# Patient Record
Sex: Male | Born: 1945 | Race: White | Hispanic: No | Marital: Married
Health system: Southern US, Community
[De-identification: ages and names within clinical notes are randomized; demographics above are authoritative.]

---

## 2004-05-03 ENCOUNTER — Encounter: Admission: RE | Admit: 2004-05-03 | Discharge: 2004-05-03 | Payer: Self-pay | Admitting: Family Medicine

## 2004-05-10 ENCOUNTER — Encounter: Admission: RE | Admit: 2004-05-10 | Discharge: 2004-05-10 | Payer: Self-pay | Admitting: Family Medicine

## 2005-05-16 ENCOUNTER — Encounter: Admission: RE | Admit: 2005-05-16 | Discharge: 2005-05-16 | Payer: Self-pay | Admitting: Family Medicine

## 2007-11-24 ENCOUNTER — Emergency Department (HOSPITAL_COMMUNITY): Admission: EM | Admit: 2007-11-24 | Discharge: 2007-11-24 | Payer: Self-pay | Admitting: Emergency Medicine

## 2009-08-22 ENCOUNTER — Emergency Department (HOSPITAL_COMMUNITY): Admission: EM | Admit: 2009-08-22 | Discharge: 2009-08-22 | Payer: Self-pay | Admitting: Emergency Medicine

## 2009-09-15 ENCOUNTER — Ambulatory Visit (HOSPITAL_COMMUNITY): Admission: RE | Admit: 2009-09-15 | Discharge: 2009-09-15 | Payer: Self-pay | Admitting: Urology

## 2009-12-07 ENCOUNTER — Ambulatory Visit (HOSPITAL_BASED_OUTPATIENT_CLINIC_OR_DEPARTMENT_OTHER): Admission: RE | Admit: 2009-12-07 | Discharge: 2009-12-07 | Payer: Self-pay | Admitting: Urology

## 2010-01-11 ENCOUNTER — Ambulatory Visit: Admission: RE | Admit: 2010-01-11 | Discharge: 2010-02-22 | Payer: Self-pay | Admitting: Radiation Oncology

## 2010-04-05 ENCOUNTER — Ambulatory Visit
Admission: RE | Admit: 2010-04-05 | Discharge: 2010-05-11 | Payer: Self-pay | Source: Home / Self Care | Admitting: Radiation Oncology

## 2010-04-23 IMAGING — NM NM RENAL IMAGING FLOW W/ PHARM
2 series · 12 of 12 positions shown · non-contrast
Comparison: CT dated 08/22/2009.

CLINICAL DATA: Horseshoe kidney with a chronic right UPJ
obstruction on a recent CT.

NUCLEAR MEDICINE RENAL SCINTIANGIOGRAPHY WITH FLOW AND FUNCTION AND
PHARMACOLOGIC AUGMENTATION
TECHNIQUE: Radionuclide angiographic and sequential renal images
were obtained after intravenous injection of radiopharmaceutical.
Imaging was continued during slow intravenous injection of Lasix
approximately 20-30 minutes after the start of the examination.
Radiopharmaceutical: 15.2 mCi technetium 99m Mag III

[Series 1: re renal qualitative · 9.44mm/px · 6 of 120 frames shown (1 of 2)]
[frame 11/120]
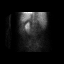
[frame 31/120]
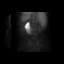
[frame 51/120]
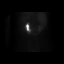
[frame 71/120]
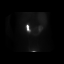
[frame 91/120]
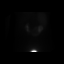
[frame 111/120]
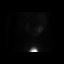

[Series 1: re renal qualitative · 9.44mm/px · 6 of 120 frames shown (2 of 2)]
[frame 11/120]
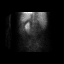
[frame 31/120]
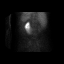
[frame 51/120]
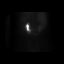
[frame 71/120]
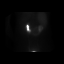
[frame 91/120]
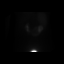
[frame 111/120]
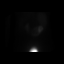

[12 of 12 positions shown; findings below may reference images not displayed]

FINDINGS: Horseshoe kidney with normal uptake and clearance of
tracer on the left.  On the right, there is persistent, low grade
accumulation of tracer without clearance following Lasix
administration.
IMPRESSION: Horseshoe kidney with a right UPJ obstruction.

## 2010-06-08 ENCOUNTER — Ambulatory Visit
Admission: RE | Admit: 2010-06-08 | Discharge: 2010-07-13 | Payer: Self-pay | Source: Home / Self Care | Attending: Radiation Oncology | Admitting: Radiation Oncology

## 2010-06-09 ENCOUNTER — Encounter
Admission: RE | Admit: 2010-06-09 | Discharge: 2010-06-09 | Payer: Self-pay | Source: Home / Self Care | Attending: Urology | Admitting: Urology

## 2010-07-06 LAB — APTT: aPTT: 33 seconds (ref 24–37)

## 2010-07-06 LAB — COMPREHENSIVE METABOLIC PANEL
ALT: 17 U/L (ref 0–53)
AST: 22 U/L (ref 0–37)
Albumin: 4.2 g/dL (ref 3.5–5.2)
Alkaline Phosphatase: 66 U/L (ref 39–117)
BUN: 22 mg/dL (ref 6–23)
CO2: 31 mEq/L (ref 19–32)
Calcium: 9.3 mg/dL (ref 8.4–10.5)
Chloride: 101 mEq/L (ref 96–112)
Creatinine, Ser: 1.68 mg/dL — ABNORMAL HIGH (ref 0.4–1.5)
GFR calc Af Amer: 50 mL/min — ABNORMAL LOW (ref >60)
GFR calc non Af Amer: 41 mL/min — ABNORMAL LOW (ref >60)
Glucose, Bld: 88 mg/dL (ref 70–99)
Potassium: 4.1 mEq/L (ref 3.5–5.1)
Sodium: 140 mEq/L (ref 135–145)
Total Bilirubin: 0.6 mg/dL (ref 0.3–1.2)
Total Protein: 6.8 g/dL (ref 6.0–8.3)

## 2010-07-06 LAB — CBC
HCT: 44 % (ref 39.0–52.0)
Hemoglobin: 15 g/dL (ref 13.0–17.0)
MCH: 31.3 pg (ref 26.0–34.0)
MCHC: 34.1 g/dL (ref 30.0–36.0)
MCV: 91.9 fL (ref 78.0–100.0)
Platelets: 175 10*3/uL (ref 150–400)
RBC: 4.79 MIL/uL (ref 4.22–5.81)
RDW: 12.4 % (ref 11.5–15.5)
WBC: 4.9 10*3/uL (ref 4.0–10.5)

## 2010-07-06 LAB — PROTIME-INR
INR: 0.97 (ref 0.00–1.49)
Prothrombin Time: 13.1 seconds (ref 11.6–15.2)

## 2010-07-12 ENCOUNTER — Ambulatory Visit
Admission: RE | Admit: 2010-07-12 | Discharge: 2010-07-12 | Payer: Self-pay | Source: Home / Self Care | Attending: Urology | Admitting: Urology

## 2010-07-14 ENCOUNTER — Ambulatory Visit: Payer: Medicare Other | Admitting: Radiation Oncology

## 2010-08-02 ENCOUNTER — Ambulatory Visit: Payer: Medicare Other | Attending: Radiation Oncology | Admitting: Radiation Oncology

## 2010-08-02 DIAGNOSIS — R350 Frequency of micturition: Secondary | ICD-10-CM | POA: Insufficient documentation

## 2010-08-02 DIAGNOSIS — C61 Malignant neoplasm of prostate: Secondary | ICD-10-CM | POA: Insufficient documentation

## 2010-08-09 NOTE — Op Note (Signed)
  NAMEELLINGTON, GREENSLADE                ACCOUNT NO.:  [id number]  MEDICAL RECORD NO.:  [id number]          PATIENT TYPE:  REC  LOCATION:  [facility]                         FACILITY:  [facility]  PHYSICIAN:  Baila Rouse I. Patsi Sears, M.D.DATE OF BIRTH:  [birth year]  DATE OF PROCEDURE: DATE OF DISCHARGE:                              OPERATIVE REPORT   PREOPERATIVE DIAGNOSIS:  T1c adenocarcinoma of the prostate.  POSTOPERATIVE DIAGNOSIS:  T1c adenocarcinoma of the prostate.  OPERATION:  Implantation of Iodine-125 radioactive seed (26 needles, 76 seeds).  PREPARATION:  After appropriate preanesthesia, the patient was brought to the operating room, placed upon the operating room in dorsal supine position where general LMA anesthesia was introduced.  He was then replaced in dorsal lithotomy position where the pubis was prepped with Betadine solution and draped in usual fashion.  REVIEW OF HISTORY:  This 64 year old male has a history of BPH treated with Flomax by Dr. Manus Gunning, with elevated PSA of 2.26 on Avodart in 2008 in a 36 mL gland.  PSA rose, however, to 5.28, and the patient had prostate biopsy showing adenocarcinoma of the prostate, Gleason 3+3 disease.  The patient had an IPSS equal to 22 prior to Flomax, but believed he was 40% better on Flomax.  IPSS decreased to 9 while on Avodart and Rapaflo combination.  The patient has a known horseshoe kidney, with a right UPJ obstruction.  PROCEDURE:  With the patient in lithotomy position, he underwent implantation of 76 seeds in 26 activated needles in his prostate. Cystoscopy after the procedure showed clot within the urethra, and the bladder balloon was popped.  However, no seeds were in the bladder or the urethra.  The urethra was irrigated free of clot, Foley catheter was placed, and the patient received IV Toradol, awakened and taken recovery room in good condition.     Merrilee Ancona I. Patsi Sears, M.D.     SIT/MEDQ  D:  07/12/2010  T:   07/12/2010  Job:  [id number]  cc:   Maryln Gottron, M.D. Fax: [phone]  Bryan Lemma. Manus Gunning, M.D. Fax: [phone]  Electronically Signed by Jethro Bolus M.D. on 08/09/2010 09:34:35 AM

## 2010-08-29 LAB — POCT HEMOGLOBIN-HEMACUE: Hemoglobin: 15.8 g/dL (ref 13.0–17.0)

## 2010-09-06 LAB — CBC
HCT: 40.3 % (ref 39.0–52.0)
Hemoglobin: 13.7 g/dL (ref 13.0–17.0)
MCHC: 33.9 g/dL (ref 30.0–36.0)
MCV: 93 fL (ref 78.0–100.0)
Platelets: 207 10*3/uL (ref 150–400)
RBC: 4.34 MIL/uL (ref 4.22–5.81)
RDW: 12.9 % (ref 11.5–15.5)
WBC: 10.6 10*3/uL — ABNORMAL HIGH (ref 4.0–10.5)

## 2010-09-06 LAB — COMPREHENSIVE METABOLIC PANEL
ALT: 26 U/L (ref 0–53)
AST: 22 U/L (ref 0–37)
Albumin: 3.6 g/dL (ref 3.5–5.2)
Alkaline Phosphatase: 71 U/L (ref 39–117)
BUN: 14 mg/dL (ref 6–23)
CO2: 27 mEq/L (ref 19–32)
Calcium: 8 mg/dL — ABNORMAL LOW (ref 8.4–10.5)
Chloride: 102 mEq/L (ref 96–112)
Creatinine, Ser: 1.23 mg/dL (ref 0.4–1.5)
GFR calc Af Amer: >60 mL/min (ref >60)
GFR calc non Af Amer: 59 mL/min — ABNORMAL LOW (ref >60)
Glucose, Bld: 93 mg/dL (ref 70–99)
Potassium: 3.1 mEq/L — ABNORMAL LOW (ref 3.5–5.1)
Sodium: 135 mEq/L (ref 135–145)
Total Bilirubin: 0.6 mg/dL (ref 0.3–1.2)
Total Protein: 6.5 g/dL (ref 6.0–8.3)

## 2010-09-06 LAB — URINALYSIS, ROUTINE W REFLEX MICROSCOPIC
Bilirubin Urine: NEGATIVE
Glucose, UA: NEGATIVE mg/dL
Hgb urine dipstick: NEGATIVE
Ketones, ur: NEGATIVE mg/dL
Nitrite: NEGATIVE
Protein, ur: NEGATIVE mg/dL
Specific Gravity, Urine: 1.016 (ref 1.005–1.030)
Urobilinogen, UA: 0.2 mg/dL (ref 0.0–1.0)
pH: 7.5 (ref 5.0–8.0)

## 2010-09-06 LAB — DIFFERENTIAL
Basophils Absolute: 0 10*3/uL (ref 0.0–0.1)
Basophils Relative: 0 % (ref 0–1)
Eosinophils Absolute: 0 10*3/uL (ref 0.0–0.7)
Eosinophils Relative: 0 % (ref 0–5)
Lymphocytes Relative: 7 % — ABNORMAL LOW (ref 12–46)
Lymphs Abs: 0.7 10*3/uL (ref 0.7–4.0)
Monocytes Absolute: 0.7 10*3/uL (ref 0.1–1.0)
Monocytes Relative: 7 % (ref 3–12)
Neutro Abs: 9.1 10*3/uL — ABNORMAL HIGH (ref 1.7–7.7)
Neutrophils Relative %: 86 % — ABNORMAL HIGH (ref 43–77)

## 2011-03-10 LAB — COMPREHENSIVE METABOLIC PANEL
ALT: 22
AST: 21
Albumin: 4
Alkaline Phosphatase: 62
BUN: 18
CO2: 31
Calcium: 9.4
Chloride: 101
Creatinine, Ser: 1.08
GFR calc Af Amer: >60
GFR calc non Af Amer: >60
Glucose, Bld: 102 — ABNORMAL HIGH
Potassium: 4.3
Sodium: 139
Total Bilirubin: 0.7
Total Protein: 6.8

## 2011-03-10 LAB — URINALYSIS, ROUTINE W REFLEX MICROSCOPIC
Bilirubin Urine: NEGATIVE
Glucose, UA: NEGATIVE
Hgb urine dipstick: NEGATIVE
Ketones, ur: NEGATIVE
Nitrite: NEGATIVE
Protein, ur: NEGATIVE
Specific Gravity, Urine: 1.03
Urobilinogen, UA: 0.2
pH: 6

## 2011-03-10 LAB — DIFFERENTIAL
Basophils Absolute: 0
Basophils Relative: 1
Eosinophils Absolute: 0.1
Eosinophils Relative: 3
Lymphocytes Relative: 23
Lymphs Abs: 1.3
Monocytes Absolute: 0.6
Monocytes Relative: 10
Neutro Abs: 3.6
Neutrophils Relative %: 63

## 2011-03-10 LAB — CBC
HCT: 44.2
Hemoglobin: 15.1
MCHC: 34.2
MCV: 91.9
Platelets: 175
RBC: 4.8
RDW: 12.2
WBC: 5.7

## 2015-07-06 DIAGNOSIS — H269 Unspecified cataract: Secondary | ICD-10-CM | POA: Diagnosis not present

## 2015-07-06 DIAGNOSIS — H1851 Endothelial corneal dystrophy: Secondary | ICD-10-CM | POA: Diagnosis not present

## 2015-08-11 DIAGNOSIS — L570 Actinic keratosis: Secondary | ICD-10-CM | POA: Diagnosis not present

## 2015-08-11 DIAGNOSIS — E559 Vitamin D deficiency, unspecified: Secondary | ICD-10-CM | POA: Diagnosis not present

## 2015-08-11 DIAGNOSIS — N183 Chronic kidney disease, stage 3 (moderate): Secondary | ICD-10-CM | POA: Diagnosis not present

## 2015-08-11 DIAGNOSIS — Z0000 Encounter for general adult medical examination without abnormal findings: Secondary | ICD-10-CM | POA: Diagnosis not present

## 2015-08-11 DIAGNOSIS — Z8546 Personal history of malignant neoplasm of prostate: Secondary | ICD-10-CM | POA: Diagnosis not present

## 2015-08-11 DIAGNOSIS — Z1389 Encounter for screening for other disorder: Secondary | ICD-10-CM | POA: Diagnosis not present

## 2015-08-11 DIAGNOSIS — I129 Hypertensive chronic kidney disease with stage 1 through stage 4 chronic kidney disease, or unspecified chronic kidney disease: Secondary | ICD-10-CM | POA: Diagnosis not present

## 2015-10-05 DIAGNOSIS — H1851 Endothelial corneal dystrophy: Secondary | ICD-10-CM | POA: Diagnosis not present

## 2015-10-05 DIAGNOSIS — H2513 Age-related nuclear cataract, bilateral: Secondary | ICD-10-CM | POA: Diagnosis not present

## 2015-10-14 DIAGNOSIS — D485 Neoplasm of uncertain behavior of skin: Secondary | ICD-10-CM | POA: Diagnosis not present

## 2015-10-14 DIAGNOSIS — L98499 Non-pressure chronic ulcer of skin of other sites with unspecified severity: Secondary | ICD-10-CM | POA: Diagnosis not present

## 2015-10-14 DIAGNOSIS — D1801 Hemangioma of skin and subcutaneous tissue: Secondary | ICD-10-CM | POA: Diagnosis not present

## 2015-10-14 DIAGNOSIS — L820 Inflamed seborrheic keratosis: Secondary | ICD-10-CM | POA: Diagnosis not present

## 2015-10-14 DIAGNOSIS — L570 Actinic keratosis: Secondary | ICD-10-CM | POA: Diagnosis not present

## 2016-04-13 DIAGNOSIS — H1851 Endothelial corneal dystrophy: Secondary | ICD-10-CM | POA: Diagnosis not present

## 2016-04-13 DIAGNOSIS — H2513 Age-related nuclear cataract, bilateral: Secondary | ICD-10-CM | POA: Diagnosis not present

## 2016-04-26 DIAGNOSIS — H1851 Endothelial corneal dystrophy: Secondary | ICD-10-CM | POA: Diagnosis not present

## 2016-04-26 DIAGNOSIS — I129 Hypertensive chronic kidney disease with stage 1 through stage 4 chronic kidney disease, or unspecified chronic kidney disease: Secondary | ICD-10-CM | POA: Diagnosis not present

## 2016-04-26 DIAGNOSIS — Z23 Encounter for immunization: Secondary | ICD-10-CM | POA: Diagnosis not present

## 2016-05-27 DIAGNOSIS — Z9841 Cataract extraction status, right eye: Secondary | ICD-10-CM | POA: Diagnosis not present

## 2016-05-27 DIAGNOSIS — Z961 Presence of intraocular lens: Secondary | ICD-10-CM | POA: Diagnosis not present

## 2016-05-27 DIAGNOSIS — Z9889 Other specified postprocedural states: Secondary | ICD-10-CM | POA: Diagnosis not present

## 2016-05-27 DIAGNOSIS — Z7982 Long term (current) use of aspirin: Secondary | ICD-10-CM | POA: Diagnosis not present

## 2016-05-27 DIAGNOSIS — H2513 Age-related nuclear cataract, bilateral: Secondary | ICD-10-CM | POA: Diagnosis not present

## 2016-05-27 DIAGNOSIS — Z79899 Other long term (current) drug therapy: Secondary | ICD-10-CM | POA: Diagnosis not present

## 2016-05-27 DIAGNOSIS — H1851 Endothelial corneal dystrophy: Secondary | ICD-10-CM | POA: Diagnosis not present

## 2016-05-27 DIAGNOSIS — I10 Essential (primary) hypertension: Secondary | ICD-10-CM | POA: Diagnosis not present

## 2016-06-16 DIAGNOSIS — Z7982 Long term (current) use of aspirin: Secondary | ICD-10-CM | POA: Diagnosis not present

## 2016-06-16 DIAGNOSIS — H2511 Age-related nuclear cataract, right eye: Secondary | ICD-10-CM | POA: Diagnosis not present

## 2016-06-16 DIAGNOSIS — Z8546 Personal history of malignant neoplasm of prostate: Secondary | ICD-10-CM | POA: Diagnosis not present

## 2016-06-16 DIAGNOSIS — H2513 Age-related nuclear cataract, bilateral: Secondary | ICD-10-CM | POA: Diagnosis not present

## 2016-06-16 DIAGNOSIS — I10 Essential (primary) hypertension: Secondary | ICD-10-CM | POA: Diagnosis not present

## 2016-06-16 DIAGNOSIS — H1851 Endothelial corneal dystrophy: Secondary | ICD-10-CM | POA: Diagnosis not present

## 2016-06-16 DIAGNOSIS — Z85828 Personal history of other malignant neoplasm of skin: Secondary | ICD-10-CM | POA: Diagnosis not present

## 2016-06-20 DIAGNOSIS — H1851 Endothelial corneal dystrophy: Secondary | ICD-10-CM | POA: Diagnosis not present

## 2016-06-20 DIAGNOSIS — H2512 Age-related nuclear cataract, left eye: Secondary | ICD-10-CM | POA: Diagnosis not present

## 2016-06-20 DIAGNOSIS — Z947 Corneal transplant status: Secondary | ICD-10-CM | POA: Diagnosis not present

## 2016-06-20 DIAGNOSIS — Z9841 Cataract extraction status, right eye: Secondary | ICD-10-CM | POA: Diagnosis not present

## 2016-06-20 DIAGNOSIS — Z961 Presence of intraocular lens: Secondary | ICD-10-CM | POA: Diagnosis not present

## 2016-06-20 DIAGNOSIS — Z48810 Encounter for surgical aftercare following surgery on the sense organs: Secondary | ICD-10-CM | POA: Diagnosis not present

## 2016-07-05 DIAGNOSIS — C61 Malignant neoplasm of prostate: Secondary | ICD-10-CM | POA: Diagnosis not present

## 2016-07-15 DIAGNOSIS — Q6211 Congenital occlusion of ureteropelvic junction: Secondary | ICD-10-CM | POA: Diagnosis not present

## 2016-07-15 DIAGNOSIS — C61 Malignant neoplasm of prostate: Secondary | ICD-10-CM | POA: Diagnosis not present

## 2016-07-15 DIAGNOSIS — N486 Induration penis plastica: Secondary | ICD-10-CM | POA: Diagnosis not present

## 2016-09-14 DIAGNOSIS — E559 Vitamin D deficiency, unspecified: Secondary | ICD-10-CM | POA: Diagnosis not present

## 2016-09-14 DIAGNOSIS — Z0000 Encounter for general adult medical examination without abnormal findings: Secondary | ICD-10-CM | POA: Diagnosis not present

## 2016-09-14 DIAGNOSIS — Z8546 Personal history of malignant neoplasm of prostate: Secondary | ICD-10-CM | POA: Diagnosis not present

## 2016-09-14 DIAGNOSIS — N183 Chronic kidney disease, stage 3 (moderate): Secondary | ICD-10-CM | POA: Diagnosis not present

## 2016-09-14 DIAGNOSIS — Z1389 Encounter for screening for other disorder: Secondary | ICD-10-CM | POA: Diagnosis not present

## 2016-09-14 DIAGNOSIS — I129 Hypertensive chronic kidney disease with stage 1 through stage 4 chronic kidney disease, or unspecified chronic kidney disease: Secondary | ICD-10-CM | POA: Diagnosis not present

## 2016-09-21 DIAGNOSIS — C61 Malignant neoplasm of prostate: Secondary | ICD-10-CM | POA: Diagnosis not present

## 2016-09-21 DIAGNOSIS — N183 Chronic kidney disease, stage 3 (moderate): Secondary | ICD-10-CM | POA: Diagnosis not present

## 2016-09-21 DIAGNOSIS — R351 Nocturia: Secondary | ICD-10-CM | POA: Diagnosis not present

## 2016-09-21 DIAGNOSIS — R350 Frequency of micturition: Secondary | ICD-10-CM | POA: Diagnosis not present

## 2016-09-21 DIAGNOSIS — Q6211 Congenital occlusion of ureteropelvic junction: Secondary | ICD-10-CM | POA: Diagnosis not present

## 2016-09-21 DIAGNOSIS — N401 Enlarged prostate with lower urinary tract symptoms: Secondary | ICD-10-CM | POA: Diagnosis not present

## 2016-10-06 DIAGNOSIS — I129 Hypertensive chronic kidney disease with stage 1 through stage 4 chronic kidney disease, or unspecified chronic kidney disease: Secondary | ICD-10-CM | POA: Diagnosis not present

## 2016-10-06 DIAGNOSIS — H1851 Endothelial corneal dystrophy: Secondary | ICD-10-CM | POA: Diagnosis not present

## 2016-10-06 DIAGNOSIS — N189 Chronic kidney disease, unspecified: Secondary | ICD-10-CM | POA: Diagnosis not present

## 2016-10-06 DIAGNOSIS — H2512 Age-related nuclear cataract, left eye: Secondary | ICD-10-CM | POA: Diagnosis not present

## 2016-10-10 DIAGNOSIS — Z79899 Other long term (current) drug therapy: Secondary | ICD-10-CM | POA: Diagnosis not present

## 2016-10-10 DIAGNOSIS — Z947 Corneal transplant status: Secondary | ICD-10-CM | POA: Diagnosis not present

## 2016-10-10 DIAGNOSIS — H1851 Endothelial corneal dystrophy: Secondary | ICD-10-CM | POA: Diagnosis not present

## 2016-10-10 DIAGNOSIS — I10 Essential (primary) hypertension: Secondary | ICD-10-CM | POA: Diagnosis not present

## 2016-10-10 DIAGNOSIS — Z7982 Long term (current) use of aspirin: Secondary | ICD-10-CM | POA: Diagnosis not present

## 2016-10-10 DIAGNOSIS — Z48810 Encounter for surgical aftercare following surgery on the sense organs: Secondary | ICD-10-CM | POA: Diagnosis not present

## 2016-10-10 DIAGNOSIS — Z9842 Cataract extraction status, left eye: Secondary | ICD-10-CM | POA: Diagnosis not present

## 2016-10-10 DIAGNOSIS — Z961 Presence of intraocular lens: Secondary | ICD-10-CM | POA: Diagnosis not present

## 2016-10-21 DIAGNOSIS — H26491 Other secondary cataract, right eye: Secondary | ICD-10-CM | POA: Diagnosis not present

## 2016-12-19 DIAGNOSIS — L300 Nummular dermatitis: Secondary | ICD-10-CM | POA: Diagnosis not present

## 2016-12-19 DIAGNOSIS — L578 Other skin changes due to chronic exposure to nonionizing radiation: Secondary | ICD-10-CM | POA: Diagnosis not present

## 2016-12-19 DIAGNOSIS — L570 Actinic keratosis: Secondary | ICD-10-CM | POA: Diagnosis not present

## 2016-12-19 DIAGNOSIS — L821 Other seborrheic keratosis: Secondary | ICD-10-CM | POA: Diagnosis not present

## 2017-02-15 DIAGNOSIS — M25579 Pain in unspecified ankle and joints of unspecified foot: Secondary | ICD-10-CM | POA: Diagnosis not present

## 2017-02-15 DIAGNOSIS — G629 Polyneuropathy, unspecified: Secondary | ICD-10-CM | POA: Diagnosis not present

## 2017-03-10 DIAGNOSIS — H26492 Other secondary cataract, left eye: Secondary | ICD-10-CM | POA: Diagnosis not present

## 2017-06-21 DIAGNOSIS — L578 Other skin changes due to chronic exposure to nonionizing radiation: Secondary | ICD-10-CM | POA: Diagnosis not present

## 2017-06-21 DIAGNOSIS — L570 Actinic keratosis: Secondary | ICD-10-CM | POA: Diagnosis not present

## 2017-08-10 DIAGNOSIS — R69 Illness, unspecified: Secondary | ICD-10-CM | POA: Diagnosis not present

## 2017-09-18 DIAGNOSIS — Z8546 Personal history of malignant neoplasm of prostate: Secondary | ICD-10-CM | POA: Diagnosis not present

## 2017-09-18 DIAGNOSIS — R350 Frequency of micturition: Secondary | ICD-10-CM | POA: Diagnosis not present

## 2017-09-18 DIAGNOSIS — N5201 Erectile dysfunction due to arterial insufficiency: Secondary | ICD-10-CM | POA: Diagnosis not present

## 2017-09-19 DIAGNOSIS — D485 Neoplasm of uncertain behavior of skin: Secondary | ICD-10-CM | POA: Diagnosis not present

## 2017-09-19 DIAGNOSIS — L578 Other skin changes due to chronic exposure to nonionizing radiation: Secondary | ICD-10-CM | POA: Diagnosis not present

## 2017-09-19 DIAGNOSIS — C44519 Basal cell carcinoma of skin of other part of trunk: Secondary | ICD-10-CM | POA: Diagnosis not present

## 2017-09-19 DIAGNOSIS — L820 Inflamed seborrheic keratosis: Secondary | ICD-10-CM | POA: Diagnosis not present

## 2017-09-19 DIAGNOSIS — L570 Actinic keratosis: Secondary | ICD-10-CM | POA: Diagnosis not present

## 2017-09-19 DIAGNOSIS — D225 Melanocytic nevi of trunk: Secondary | ICD-10-CM | POA: Diagnosis not present

## 2017-09-28 DIAGNOSIS — C44519 Basal cell carcinoma of skin of other part of trunk: Secondary | ICD-10-CM | POA: Diagnosis not present

## 2017-10-17 DIAGNOSIS — Z1159 Encounter for screening for other viral diseases: Secondary | ICD-10-CM | POA: Diagnosis not present

## 2017-10-17 DIAGNOSIS — Z8546 Personal history of malignant neoplasm of prostate: Secondary | ICD-10-CM | POA: Diagnosis not present

## 2017-10-17 DIAGNOSIS — I129 Hypertensive chronic kidney disease with stage 1 through stage 4 chronic kidney disease, or unspecified chronic kidney disease: Secondary | ICD-10-CM | POA: Diagnosis not present

## 2017-10-17 DIAGNOSIS — N183 Chronic kidney disease, stage 3 (moderate): Secondary | ICD-10-CM | POA: Diagnosis not present

## 2017-10-17 DIAGNOSIS — Z1389 Encounter for screening for other disorder: Secondary | ICD-10-CM | POA: Diagnosis not present

## 2017-10-17 DIAGNOSIS — Z0000 Encounter for general adult medical examination without abnormal findings: Secondary | ICD-10-CM | POA: Diagnosis not present

## 2017-10-17 DIAGNOSIS — E559 Vitamin D deficiency, unspecified: Secondary | ICD-10-CM | POA: Diagnosis not present

## 2017-10-17 DIAGNOSIS — Z13220 Encounter for screening for lipoid disorders: Secondary | ICD-10-CM | POA: Diagnosis not present

## 2017-10-30 DIAGNOSIS — Q6211 Congenital occlusion of ureteropelvic junction: Secondary | ICD-10-CM | POA: Diagnosis not present

## 2017-10-30 DIAGNOSIS — R3912 Poor urinary stream: Secondary | ICD-10-CM | POA: Diagnosis not present

## 2017-10-30 DIAGNOSIS — N401 Enlarged prostate with lower urinary tract symptoms: Secondary | ICD-10-CM | POA: Diagnosis not present

## 2017-10-30 DIAGNOSIS — R350 Frequency of micturition: Secondary | ICD-10-CM | POA: Diagnosis not present

## 2017-11-08 DIAGNOSIS — Q6211 Congenital occlusion of ureteropelvic junction: Secondary | ICD-10-CM | POA: Diagnosis not present

## 2017-11-08 DIAGNOSIS — K449 Diaphragmatic hernia without obstruction or gangrene: Secondary | ICD-10-CM | POA: Diagnosis not present

## 2017-11-09 DIAGNOSIS — L578 Other skin changes due to chronic exposure to nonionizing radiation: Secondary | ICD-10-CM | POA: Diagnosis not present

## 2017-11-09 DIAGNOSIS — J019 Acute sinusitis, unspecified: Secondary | ICD-10-CM | POA: Diagnosis not present

## 2017-11-09 DIAGNOSIS — Z85828 Personal history of other malignant neoplasm of skin: Secondary | ICD-10-CM | POA: Diagnosis not present

## 2018-01-24 DIAGNOSIS — Q6211 Congenital occlusion of ureteropelvic junction: Secondary | ICD-10-CM | POA: Diagnosis not present

## 2018-03-21 DIAGNOSIS — L821 Other seborrheic keratosis: Secondary | ICD-10-CM | POA: Diagnosis not present

## 2018-03-21 DIAGNOSIS — D225 Melanocytic nevi of trunk: Secondary | ICD-10-CM | POA: Diagnosis not present

## 2018-03-21 DIAGNOSIS — D1801 Hemangioma of skin and subcutaneous tissue: Secondary | ICD-10-CM | POA: Diagnosis not present

## 2018-03-21 DIAGNOSIS — L820 Inflamed seborrheic keratosis: Secondary | ICD-10-CM | POA: Diagnosis not present

## 2018-03-21 DIAGNOSIS — Z85828 Personal history of other malignant neoplasm of skin: Secondary | ICD-10-CM | POA: Diagnosis not present

## 2018-03-21 DIAGNOSIS — L814 Other melanin hyperpigmentation: Secondary | ICD-10-CM | POA: Diagnosis not present

## 2018-04-20 DIAGNOSIS — Z23 Encounter for immunization: Secondary | ICD-10-CM | POA: Diagnosis not present

## 2018-07-30 DIAGNOSIS — Z8546 Personal history of malignant neoplasm of prostate: Secondary | ICD-10-CM | POA: Diagnosis not present

## 2018-08-06 DIAGNOSIS — R3912 Poor urinary stream: Secondary | ICD-10-CM | POA: Diagnosis not present

## 2018-08-06 DIAGNOSIS — Q6211 Congenital occlusion of ureteropelvic junction: Secondary | ICD-10-CM | POA: Diagnosis not present

## 2018-08-06 DIAGNOSIS — N401 Enlarged prostate with lower urinary tract symptoms: Secondary | ICD-10-CM | POA: Diagnosis not present

## 2018-08-06 DIAGNOSIS — N5201 Erectile dysfunction due to arterial insufficiency: Secondary | ICD-10-CM | POA: Diagnosis not present

## 2018-09-04 DIAGNOSIS — M791 Myalgia, unspecified site: Secondary | ICD-10-CM | POA: Diagnosis not present

## 2018-10-23 DIAGNOSIS — E559 Vitamin D deficiency, unspecified: Secondary | ICD-10-CM | POA: Diagnosis not present

## 2018-10-23 DIAGNOSIS — Z0000 Encounter for general adult medical examination without abnormal findings: Secondary | ICD-10-CM | POA: Diagnosis not present

## 2018-10-23 DIAGNOSIS — I129 Hypertensive chronic kidney disease with stage 1 through stage 4 chronic kidney disease, or unspecified chronic kidney disease: Secondary | ICD-10-CM | POA: Diagnosis not present

## 2018-10-23 DIAGNOSIS — N183 Chronic kidney disease, stage 3 (moderate): Secondary | ICD-10-CM | POA: Diagnosis not present

## 2018-10-23 DIAGNOSIS — Z8546 Personal history of malignant neoplasm of prostate: Secondary | ICD-10-CM | POA: Diagnosis not present

## 2018-10-25 DIAGNOSIS — I129 Hypertensive chronic kidney disease with stage 1 through stage 4 chronic kidney disease, or unspecified chronic kidney disease: Secondary | ICD-10-CM | POA: Diagnosis not present

## 2018-10-25 DIAGNOSIS — E559 Vitamin D deficiency, unspecified: Secondary | ICD-10-CM | POA: Diagnosis not present

## 2018-11-27 DIAGNOSIS — N200 Calculus of kidney: Secondary | ICD-10-CM | POA: Diagnosis not present

## 2018-11-27 DIAGNOSIS — I129 Hypertensive chronic kidney disease with stage 1 through stage 4 chronic kidney disease, or unspecified chronic kidney disease: Secondary | ICD-10-CM | POA: Diagnosis not present

## 2018-11-27 DIAGNOSIS — N400 Enlarged prostate without lower urinary tract symptoms: Secondary | ICD-10-CM | POA: Diagnosis not present

## 2018-11-27 DIAGNOSIS — Q631 Lobulated, fused and horseshoe kidney: Secondary | ICD-10-CM | POA: Diagnosis not present

## 2018-11-27 DIAGNOSIS — N183 Chronic kidney disease, stage 3 (moderate): Secondary | ICD-10-CM | POA: Diagnosis not present

## 2019-02-23 DIAGNOSIS — Z23 Encounter for immunization: Secondary | ICD-10-CM | POA: Diagnosis not present

## 2019-03-04 DIAGNOSIS — R0789 Other chest pain: Secondary | ICD-10-CM | POA: Diagnosis not present

## 2019-07-11 ENCOUNTER — Ambulatory Visit: Payer: PRIVATE HEALTH INSURANCE

## 2019-07-19 ENCOUNTER — Ambulatory Visit: Payer: PPO | Attending: Internal Medicine

## 2019-07-19 DIAGNOSIS — Z8546 Personal history of malignant neoplasm of prostate: Secondary | ICD-10-CM | POA: Diagnosis not present

## 2019-07-19 DIAGNOSIS — Z23 Encounter for immunization: Secondary | ICD-10-CM

## 2019-07-19 NOTE — Progress Notes (Signed)
   Covid-19 Vaccination Clinic  Name:  Oscar Nelson    MRN: [id number] DOB: 28-Oct-1945  07/19/2019  Oscar Nelson was observed post Covid-19 immunization for 15 minutes without incidence. He was provided with Vaccine Information Sheet and instruction to access the V-Safe system.   Oscar Nelson was instructed to call 911 with any severe reactions post vaccine: Marland Kitchen Difficulty breathing  . Swelling of your face and throat  . A fast heartbeat  . A bad rash all over your body  . Dizziness and weakness    Immunizations Administered    Name Date Dose VIS Date Route   Pfizer COVID-19 Vaccine 07/19/2019  3:47 PM 0.3 mL 05/24/2019 Intramuscular   Manufacturer: [place]   Lot: [id number]   [place]: [id number]

## 2019-07-26 DIAGNOSIS — Z8546 Personal history of malignant neoplasm of prostate: Secondary | ICD-10-CM | POA: Diagnosis not present

## 2019-07-26 DIAGNOSIS — Q6211 Congenital occlusion of ureteropelvic junction: Secondary | ICD-10-CM | POA: Diagnosis not present

## 2019-07-26 DIAGNOSIS — N5201 Erectile dysfunction due to arterial insufficiency: Secondary | ICD-10-CM | POA: Diagnosis not present

## 2019-07-26 DIAGNOSIS — R3915 Urgency of urination: Secondary | ICD-10-CM | POA: Diagnosis not present

## 2019-08-01 ENCOUNTER — Ambulatory Visit: Payer: PRIVATE HEALTH INSURANCE

## 2019-08-13 ENCOUNTER — Ambulatory Visit: Payer: PPO | Attending: Internal Medicine

## 2019-08-13 DIAGNOSIS — Z23 Encounter for immunization: Secondary | ICD-10-CM | POA: Insufficient documentation

## 2019-08-13 NOTE — Progress Notes (Signed)
   Covid-19 Vaccination Clinic  Name:  Oscar Nelson    MRN: [id number] DOB: [birth year]  08/13/2019  [name] was observed post Covid-19 immunization for 15 minutes without incident. He was provided with Vaccine Information Sheet and instruction to access the V-Safe system.   [name] was instructed to call 911 with any severe reactions post vaccine: Marland Kitchen Difficulty breathing  . Swelling of face and throat  . A fast heartbeat  . A bad rash all over body  . Dizziness and weakness   Immunizations Administered    Name Date Dose VIS Date Route   Pfizer COVID-19 Vaccine 08/13/2019  3:30 PM 0.3 mL 05/24/2019 Intramuscular   Manufacturer: [place]   Lot: [id number]   [place]: [id number]

## 2019-08-28 DIAGNOSIS — N400 Enlarged prostate without lower urinary tract symptoms: Secondary | ICD-10-CM | POA: Diagnosis not present

## 2019-08-28 DIAGNOSIS — N1831 Chronic kidney disease, stage 3a: Secondary | ICD-10-CM | POA: Diagnosis not present

## 2019-08-28 DIAGNOSIS — N200 Calculus of kidney: Secondary | ICD-10-CM | POA: Diagnosis not present

## 2019-08-28 DIAGNOSIS — E559 Vitamin D deficiency, unspecified: Secondary | ICD-10-CM | POA: Diagnosis not present

## 2019-08-28 DIAGNOSIS — Q631 Lobulated, fused and horseshoe kidney: Secondary | ICD-10-CM | POA: Diagnosis not present

## 2019-08-28 DIAGNOSIS — I129 Hypertensive chronic kidney disease with stage 1 through stage 4 chronic kidney disease, or unspecified chronic kidney disease: Secondary | ICD-10-CM | POA: Diagnosis not present

## 2019-10-25 DIAGNOSIS — Z0000 Encounter for general adult medical examination without abnormal findings: Secondary | ICD-10-CM | POA: Diagnosis not present

## 2019-10-25 DIAGNOSIS — Z1211 Encounter for screening for malignant neoplasm of colon: Secondary | ICD-10-CM | POA: Diagnosis not present

## 2019-10-25 DIAGNOSIS — Z1389 Encounter for screening for other disorder: Secondary | ICD-10-CM | POA: Diagnosis not present

## 2019-10-25 DIAGNOSIS — E559 Vitamin D deficiency, unspecified: Secondary | ICD-10-CM | POA: Diagnosis not present

## 2019-10-25 DIAGNOSIS — N183 Chronic kidney disease, stage 3 unspecified: Secondary | ICD-10-CM | POA: Diagnosis not present

## 2019-10-25 DIAGNOSIS — Z8546 Personal history of malignant neoplasm of prostate: Secondary | ICD-10-CM | POA: Diagnosis not present

## 2019-10-25 DIAGNOSIS — I129 Hypertensive chronic kidney disease with stage 1 through stage 4 chronic kidney disease, or unspecified chronic kidney disease: Secondary | ICD-10-CM | POA: Diagnosis not present

## 2020-03-09 DIAGNOSIS — Z1159 Encounter for screening for other viral diseases: Secondary | ICD-10-CM | POA: Diagnosis not present

## 2020-03-12 DIAGNOSIS — K5730 Diverticulosis of large intestine without perforation or abscess without bleeding: Secondary | ICD-10-CM | POA: Diagnosis not present

## 2020-03-12 DIAGNOSIS — K635 Polyp of colon: Secondary | ICD-10-CM | POA: Diagnosis not present

## 2020-03-12 DIAGNOSIS — Z1211 Encounter for screening for malignant neoplasm of colon: Secondary | ICD-10-CM | POA: Diagnosis not present

## 2020-03-12 DIAGNOSIS — K648 Other hemorrhoids: Secondary | ICD-10-CM | POA: Diagnosis not present

## 2020-03-17 DIAGNOSIS — K635 Polyp of colon: Secondary | ICD-10-CM | POA: Diagnosis not present

## 2020-03-21 DIAGNOSIS — Z23 Encounter for immunization: Secondary | ICD-10-CM | POA: Diagnosis not present

## 2020-04-22 DIAGNOSIS — H903 Sensorineural hearing loss, bilateral: Secondary | ICD-10-CM | POA: Diagnosis not present

## 2020-07-31 DIAGNOSIS — N5201 Erectile dysfunction due to arterial insufficiency: Secondary | ICD-10-CM | POA: Diagnosis not present

## 2020-07-31 DIAGNOSIS — Q6211 Congenital occlusion of ureteropelvic junction: Secondary | ICD-10-CM | POA: Diagnosis not present

## 2020-07-31 DIAGNOSIS — N401 Enlarged prostate with lower urinary tract symptoms: Secondary | ICD-10-CM | POA: Diagnosis not present

## 2020-07-31 DIAGNOSIS — R350 Frequency of micturition: Secondary | ICD-10-CM | POA: Diagnosis not present

## 2020-08-11 DIAGNOSIS — N1831 Chronic kidney disease, stage 3a: Secondary | ICD-10-CM | POA: Diagnosis not present

## 2020-08-11 DIAGNOSIS — N183 Chronic kidney disease, stage 3 unspecified: Secondary | ICD-10-CM | POA: Diagnosis not present

## 2020-08-20 DIAGNOSIS — N200 Calculus of kidney: Secondary | ICD-10-CM | POA: Diagnosis not present

## 2020-08-20 DIAGNOSIS — C61 Malignant neoplasm of prostate: Secondary | ICD-10-CM | POA: Diagnosis not present

## 2020-08-20 DIAGNOSIS — I129 Hypertensive chronic kidney disease with stage 1 through stage 4 chronic kidney disease, or unspecified chronic kidney disease: Secondary | ICD-10-CM | POA: Diagnosis not present

## 2020-08-20 DIAGNOSIS — Q631 Lobulated, fused and horseshoe kidney: Secondary | ICD-10-CM | POA: Diagnosis not present

## 2020-08-20 DIAGNOSIS — N400 Enlarged prostate without lower urinary tract symptoms: Secondary | ICD-10-CM | POA: Diagnosis not present

## 2020-08-20 DIAGNOSIS — E559 Vitamin D deficiency, unspecified: Secondary | ICD-10-CM | POA: Diagnosis not present

## 2020-08-20 DIAGNOSIS — N1831 Chronic kidney disease, stage 3a: Secondary | ICD-10-CM | POA: Diagnosis not present

## 2020-08-26 DIAGNOSIS — Z9889 Other specified postprocedural states: Secondary | ICD-10-CM | POA: Diagnosis not present

## 2020-08-26 DIAGNOSIS — Z9842 Cataract extraction status, left eye: Secondary | ICD-10-CM | POA: Diagnosis not present

## 2020-08-26 DIAGNOSIS — Z9841 Cataract extraction status, right eye: Secondary | ICD-10-CM | POA: Diagnosis not present

## 2020-08-26 DIAGNOSIS — Z947 Corneal transplant status: Secondary | ICD-10-CM | POA: Diagnosis not present

## 2020-08-26 DIAGNOSIS — H40003 Preglaucoma, unspecified, bilateral: Secondary | ICD-10-CM | POA: Diagnosis not present

## 2020-08-26 DIAGNOSIS — Z961 Presence of intraocular lens: Secondary | ICD-10-CM | POA: Diagnosis not present

## 2020-10-26 DIAGNOSIS — E559 Vitamin D deficiency, unspecified: Secondary | ICD-10-CM | POA: Diagnosis not present

## 2020-10-26 DIAGNOSIS — I129 Hypertensive chronic kidney disease with stage 1 through stage 4 chronic kidney disease, or unspecified chronic kidney disease: Secondary | ICD-10-CM | POA: Diagnosis not present

## 2020-10-26 DIAGNOSIS — Z1389 Encounter for screening for other disorder: Secondary | ICD-10-CM | POA: Diagnosis not present

## 2020-10-26 DIAGNOSIS — N183 Chronic kidney disease, stage 3 unspecified: Secondary | ICD-10-CM | POA: Diagnosis not present

## 2020-10-26 DIAGNOSIS — H40003 Preglaucoma, unspecified, bilateral: Secondary | ICD-10-CM | POA: Diagnosis not present

## 2020-10-26 DIAGNOSIS — Z8546 Personal history of malignant neoplasm of prostate: Secondary | ICD-10-CM | POA: Diagnosis not present

## 2020-10-26 DIAGNOSIS — Z0000 Encounter for general adult medical examination without abnormal findings: Secondary | ICD-10-CM | POA: Diagnosis not present

## 2020-12-30 DIAGNOSIS — H40003 Preglaucoma, unspecified, bilateral: Secondary | ICD-10-CM | POA: Diagnosis not present

## 2021-01-05 DIAGNOSIS — L814 Other melanin hyperpigmentation: Secondary | ICD-10-CM | POA: Diagnosis not present

## 2021-01-05 DIAGNOSIS — L570 Actinic keratosis: Secondary | ICD-10-CM | POA: Diagnosis not present

## 2021-01-05 DIAGNOSIS — L578 Other skin changes due to chronic exposure to nonionizing radiation: Secondary | ICD-10-CM | POA: Diagnosis not present

## 2021-01-05 DIAGNOSIS — L821 Other seborrheic keratosis: Secondary | ICD-10-CM | POA: Diagnosis not present

## 2021-01-05 DIAGNOSIS — D225 Melanocytic nevi of trunk: Secondary | ICD-10-CM | POA: Diagnosis not present

## 2021-01-05 DIAGNOSIS — L738 Other specified follicular disorders: Secondary | ICD-10-CM | POA: Diagnosis not present

## 2021-01-05 DIAGNOSIS — Z85828 Personal history of other malignant neoplasm of skin: Secondary | ICD-10-CM | POA: Diagnosis not present

## 2021-01-05 DIAGNOSIS — D1801 Hemangioma of skin and subcutaneous tissue: Secondary | ICD-10-CM | POA: Diagnosis not present

## 2021-01-05 DIAGNOSIS — L820 Inflamed seborrheic keratosis: Secondary | ICD-10-CM | POA: Diagnosis not present

## 2021-01-05 DIAGNOSIS — L905 Scar conditions and fibrosis of skin: Secondary | ICD-10-CM | POA: Diagnosis not present

## 2021-01-25 DIAGNOSIS — H40003 Preglaucoma, unspecified, bilateral: Secondary | ICD-10-CM | POA: Diagnosis not present

## 2021-02-22 DIAGNOSIS — N1831 Chronic kidney disease, stage 3a: Secondary | ICD-10-CM | POA: Diagnosis not present

## 2021-02-27 DIAGNOSIS — Z23 Encounter for immunization: Secondary | ICD-10-CM | POA: Diagnosis not present

## 2021-03-02 DIAGNOSIS — N400 Enlarged prostate without lower urinary tract symptoms: Secondary | ICD-10-CM | POA: Diagnosis not present

## 2021-03-02 DIAGNOSIS — N200 Calculus of kidney: Secondary | ICD-10-CM | POA: Diagnosis not present

## 2021-03-02 DIAGNOSIS — Q631 Lobulated, fused and horseshoe kidney: Secondary | ICD-10-CM | POA: Diagnosis not present

## 2021-03-02 DIAGNOSIS — I129 Hypertensive chronic kidney disease with stage 1 through stage 4 chronic kidney disease, or unspecified chronic kidney disease: Secondary | ICD-10-CM | POA: Diagnosis not present

## 2021-03-02 DIAGNOSIS — C61 Malignant neoplasm of prostate: Secondary | ICD-10-CM | POA: Diagnosis not present

## 2021-03-02 DIAGNOSIS — E559 Vitamin D deficiency, unspecified: Secondary | ICD-10-CM | POA: Diagnosis not present

## 2021-03-02 DIAGNOSIS — N1831 Chronic kidney disease, stage 3a: Secondary | ICD-10-CM | POA: Diagnosis not present

## 2021-07-05 DIAGNOSIS — L570 Actinic keratosis: Secondary | ICD-10-CM | POA: Diagnosis not present

## 2021-08-12 DIAGNOSIS — Q6211 Congenital occlusion of ureteropelvic junction: Secondary | ICD-10-CM | POA: Diagnosis not present

## 2021-08-12 DIAGNOSIS — Z8546 Personal history of malignant neoplasm of prostate: Secondary | ICD-10-CM | POA: Diagnosis not present

## 2021-08-12 DIAGNOSIS — N401 Enlarged prostate with lower urinary tract symptoms: Secondary | ICD-10-CM | POA: Diagnosis not present

## 2021-08-12 DIAGNOSIS — R350 Frequency of micturition: Secondary | ICD-10-CM | POA: Diagnosis not present

## 2021-08-30 DIAGNOSIS — H9201 Otalgia, right ear: Secondary | ICD-10-CM | POA: Diagnosis not present

## 2021-09-06 DIAGNOSIS — L570 Actinic keratosis: Secondary | ICD-10-CM | POA: Diagnosis not present

## 2021-09-08 DIAGNOSIS — Z947 Corneal transplant status: Secondary | ICD-10-CM | POA: Diagnosis not present

## 2021-09-08 DIAGNOSIS — Z961 Presence of intraocular lens: Secondary | ICD-10-CM | POA: Diagnosis not present

## 2021-09-08 DIAGNOSIS — H40003 Preglaucoma, unspecified, bilateral: Secondary | ICD-10-CM | POA: Diagnosis not present

## 2021-09-24 DIAGNOSIS — H40003 Preglaucoma, unspecified, bilateral: Secondary | ICD-10-CM | POA: Diagnosis not present

## 2021-10-06 DIAGNOSIS — H401112 Primary open-angle glaucoma, right eye, moderate stage: Secondary | ICD-10-CM | POA: Diagnosis not present

## 2021-10-06 DIAGNOSIS — H40003 Preglaucoma, unspecified, bilateral: Secondary | ICD-10-CM | POA: Diagnosis not present

## 2021-10-20 DIAGNOSIS — H524 Presbyopia: Secondary | ICD-10-CM | POA: Diagnosis not present

## 2021-10-20 DIAGNOSIS — H52203 Unspecified astigmatism, bilateral: Secondary | ICD-10-CM | POA: Diagnosis not present

## 2021-10-28 DIAGNOSIS — Z0000 Encounter for general adult medical examination without abnormal findings: Secondary | ICD-10-CM | POA: Diagnosis not present

## 2021-10-28 DIAGNOSIS — Z8546 Personal history of malignant neoplasm of prostate: Secondary | ICD-10-CM | POA: Diagnosis not present

## 2021-10-28 DIAGNOSIS — I129 Hypertensive chronic kidney disease with stage 1 through stage 4 chronic kidney disease, or unspecified chronic kidney disease: Secondary | ICD-10-CM | POA: Diagnosis not present

## 2021-10-28 DIAGNOSIS — H18519 Endothelial corneal dystrophy, unspecified eye: Secondary | ICD-10-CM | POA: Diagnosis not present

## 2021-10-28 DIAGNOSIS — M79671 Pain in right foot: Secondary | ICD-10-CM | POA: Diagnosis not present

## 2021-10-28 DIAGNOSIS — N183 Chronic kidney disease, stage 3 unspecified: Secondary | ICD-10-CM | POA: Diagnosis not present

## 2021-10-28 DIAGNOSIS — M6530 Trigger finger, unspecified finger: Secondary | ICD-10-CM | POA: Diagnosis not present

## 2021-10-28 DIAGNOSIS — E559 Vitamin D deficiency, unspecified: Secondary | ICD-10-CM | POA: Diagnosis not present

## 2021-10-28 DIAGNOSIS — H409 Unspecified glaucoma: Secondary | ICD-10-CM | POA: Diagnosis not present

## 2021-11-04 DIAGNOSIS — H401112 Primary open-angle glaucoma, right eye, moderate stage: Secondary | ICD-10-CM | POA: Diagnosis not present

## 2021-11-04 DIAGNOSIS — H40002 Preglaucoma, unspecified, left eye: Secondary | ICD-10-CM | POA: Diagnosis not present

## 2021-12-07 DIAGNOSIS — L570 Actinic keratosis: Secondary | ICD-10-CM | POA: Diagnosis not present

## 2021-12-07 DIAGNOSIS — L821 Other seborrheic keratosis: Secondary | ICD-10-CM | POA: Diagnosis not present

## 2021-12-07 DIAGNOSIS — D485 Neoplasm of uncertain behavior of skin: Secondary | ICD-10-CM | POA: Diagnosis not present

## 2021-12-07 DIAGNOSIS — Z09 Encounter for follow-up examination after completed treatment for conditions other than malignant neoplasm: Secondary | ICD-10-CM | POA: Diagnosis not present

## 2021-12-07 DIAGNOSIS — L820 Inflamed seborrheic keratosis: Secondary | ICD-10-CM | POA: Diagnosis not present

## 2021-12-07 DIAGNOSIS — L538 Other specified erythematous conditions: Secondary | ICD-10-CM | POA: Diagnosis not present

## 2022-02-22 DIAGNOSIS — L111 Transient acantholytic dermatosis [Grover]: Secondary | ICD-10-CM | POA: Diagnosis not present

## 2022-02-22 DIAGNOSIS — Z09 Encounter for follow-up examination after completed treatment for conditions other than malignant neoplasm: Secondary | ICD-10-CM | POA: Diagnosis not present

## 2022-02-22 DIAGNOSIS — Z08 Encounter for follow-up examination after completed treatment for malignant neoplasm: Secondary | ICD-10-CM | POA: Diagnosis not present

## 2022-02-22 DIAGNOSIS — L538 Other specified erythematous conditions: Secondary | ICD-10-CM | POA: Diagnosis not present

## 2022-02-22 DIAGNOSIS — D225 Melanocytic nevi of trunk: Secondary | ICD-10-CM | POA: Diagnosis not present

## 2022-02-22 DIAGNOSIS — L821 Other seborrheic keratosis: Secondary | ICD-10-CM | POA: Diagnosis not present

## 2022-02-22 DIAGNOSIS — Z85828 Personal history of other malignant neoplasm of skin: Secondary | ICD-10-CM | POA: Diagnosis not present

## 2022-02-22 DIAGNOSIS — L570 Actinic keratosis: Secondary | ICD-10-CM | POA: Diagnosis not present

## 2022-02-22 DIAGNOSIS — L814 Other melanin hyperpigmentation: Secondary | ICD-10-CM | POA: Diagnosis not present

## 2022-02-22 DIAGNOSIS — D485 Neoplasm of uncertain behavior of skin: Secondary | ICD-10-CM | POA: Diagnosis not present

## 2022-03-12 DIAGNOSIS — Z23 Encounter for immunization: Secondary | ICD-10-CM | POA: Diagnosis not present

## 2022-04-12 DIAGNOSIS — N183 Chronic kidney disease, stage 3 unspecified: Secondary | ICD-10-CM | POA: Diagnosis not present

## 2022-04-18 DIAGNOSIS — E559 Vitamin D deficiency, unspecified: Secondary | ICD-10-CM | POA: Diagnosis not present

## 2022-04-18 DIAGNOSIS — I129 Hypertensive chronic kidney disease with stage 1 through stage 4 chronic kidney disease, or unspecified chronic kidney disease: Secondary | ICD-10-CM | POA: Diagnosis not present

## 2022-04-18 DIAGNOSIS — N400 Enlarged prostate without lower urinary tract symptoms: Secondary | ICD-10-CM | POA: Diagnosis not present

## 2022-04-18 DIAGNOSIS — N1831 Chronic kidney disease, stage 3a: Secondary | ICD-10-CM | POA: Diagnosis not present

## 2022-04-18 DIAGNOSIS — N200 Calculus of kidney: Secondary | ICD-10-CM | POA: Diagnosis not present

## 2022-04-18 DIAGNOSIS — Q631 Lobulated, fused and horseshoe kidney: Secondary | ICD-10-CM | POA: Diagnosis not present

## 2022-05-30 DIAGNOSIS — H401112 Primary open-angle glaucoma, right eye, moderate stage: Secondary | ICD-10-CM | POA: Diagnosis not present

## 2022-05-30 DIAGNOSIS — H40002 Preglaucoma, unspecified, left eye: Secondary | ICD-10-CM | POA: Diagnosis not present

## 2022-06-28 DIAGNOSIS — D225 Melanocytic nevi of trunk: Secondary | ICD-10-CM | POA: Diagnosis not present

## 2022-06-28 DIAGNOSIS — L814 Other melanin hyperpigmentation: Secondary | ICD-10-CM | POA: Diagnosis not present

## 2022-06-28 DIAGNOSIS — L570 Actinic keratosis: Secondary | ICD-10-CM | POA: Diagnosis not present

## 2022-06-28 DIAGNOSIS — D2262 Melanocytic nevi of left upper limb, including shoulder: Secondary | ICD-10-CM | POA: Diagnosis not present

## 2022-06-28 DIAGNOSIS — D2261 Melanocytic nevi of right upper limb, including shoulder: Secondary | ICD-10-CM | POA: Diagnosis not present

## 2022-06-28 DIAGNOSIS — Z09 Encounter for follow-up examination after completed treatment for conditions other than malignant neoplasm: Secondary | ICD-10-CM | POA: Diagnosis not present

## 2022-09-21 DIAGNOSIS — Z8546 Personal history of malignant neoplasm of prostate: Secondary | ICD-10-CM | POA: Diagnosis not present

## 2022-09-21 DIAGNOSIS — Q6211 Congenital occlusion of ureteropelvic junction: Secondary | ICD-10-CM | POA: Diagnosis not present

## 2022-09-21 DIAGNOSIS — N5201 Erectile dysfunction due to arterial insufficiency: Secondary | ICD-10-CM | POA: Diagnosis not present

## 2022-09-21 DIAGNOSIS — N183 Chronic kidney disease, stage 3 unspecified: Secondary | ICD-10-CM | POA: Diagnosis not present

## 2022-09-21 DIAGNOSIS — R3912 Poor urinary stream: Secondary | ICD-10-CM | POA: Diagnosis not present

## 2022-10-31 DIAGNOSIS — H401112 Primary open-angle glaucoma, right eye, moderate stage: Secondary | ICD-10-CM | POA: Diagnosis not present

## 2022-10-31 DIAGNOSIS — H40002 Preglaucoma, unspecified, left eye: Secondary | ICD-10-CM | POA: Diagnosis not present

## 2022-11-01 DIAGNOSIS — H18519 Endothelial corneal dystrophy, unspecified eye: Secondary | ICD-10-CM | POA: Diagnosis not present

## 2022-11-01 DIAGNOSIS — E559 Vitamin D deficiency, unspecified: Secondary | ICD-10-CM | POA: Diagnosis not present

## 2022-11-01 DIAGNOSIS — N183 Chronic kidney disease, stage 3 unspecified: Secondary | ICD-10-CM | POA: Diagnosis not present

## 2022-11-01 DIAGNOSIS — Z8546 Personal history of malignant neoplasm of prostate: Secondary | ICD-10-CM | POA: Diagnosis not present

## 2022-11-01 DIAGNOSIS — M79671 Pain in right foot: Secondary | ICD-10-CM | POA: Diagnosis not present

## 2022-11-01 DIAGNOSIS — I129 Hypertensive chronic kidney disease with stage 1 through stage 4 chronic kidney disease, or unspecified chronic kidney disease: Secondary | ICD-10-CM | POA: Diagnosis not present

## 2022-11-01 DIAGNOSIS — H409 Unspecified glaucoma: Secondary | ICD-10-CM | POA: Diagnosis not present

## 2022-11-01 DIAGNOSIS — Z1331 Encounter for screening for depression: Secondary | ICD-10-CM | POA: Diagnosis not present

## 2022-11-01 DIAGNOSIS — Z0000 Encounter for general adult medical examination without abnormal findings: Secondary | ICD-10-CM | POA: Diagnosis not present

## 2022-11-09 ENCOUNTER — Ambulatory Visit (INDEPENDENT_AMBULATORY_CARE_PROVIDER_SITE_OTHER): Payer: PPO

## 2022-11-09 ENCOUNTER — Ambulatory Visit: Payer: PPO | Admitting: Podiatry

## 2022-11-09 ENCOUNTER — Encounter: Payer: Self-pay | Admitting: Podiatry

## 2022-11-09 DIAGNOSIS — M778 Other enthesopathies, not elsewhere classified: Secondary | ICD-10-CM | POA: Diagnosis not present

## 2022-11-09 DIAGNOSIS — M7751 Other enthesopathy of right foot: Secondary | ICD-10-CM | POA: Diagnosis not present

## 2022-11-09 DIAGNOSIS — M7752 Other enthesopathy of left foot: Secondary | ICD-10-CM | POA: Diagnosis not present

## 2022-11-09 MED ORDER — TRIAMCINOLONE ACETONIDE 10 MG/ML IJ SUSP
20.0000 mg | Freq: Once | INTRAMUSCULAR | Status: AC
Start: 2022-11-09 — End: 2022-11-09
  Administered 2022-11-09: 20 mg

## 2022-11-09 NOTE — Progress Notes (Signed)
Subjective:   Patient ID: Oscar Nelson, male   DOB: 77 y.o.   MRN: [id number]   HPI Patient presents stating his had pain in his feet for years mostly in the forefoot but over the last couple years more in the big toe joints stating they have been chronically sore and making it hard to walk.  He is concerned about neuropathy neuroma and arthritis and does not smoke tries to be active   Review of Systems  All other systems reviewed and are negative.       Objective:  Physical Exam Vitals and nursing note reviewed.  Constitutional:      Appearance: He is well-developed.  Pulmonary:     Effort: Pulmonary effort is normal.  Musculoskeletal:        General: Normal range of motion.  Skin:    General: Skin is warm.  Neurological:     Mental Status: He is alert.     Neurovascular status found to be intact muscle strength was found to be adequate range of motion adequate with inflammation and pain around the first MPJ bilateral reduced motion fluid buildup around the joint surfaces moderate digital deformity second left and mild discomfort around the lesser MPJs bilateral with negative indications of neuroma symptomatology.  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

## 2022-12-12 DIAGNOSIS — H40002 Preglaucoma, unspecified, left eye: Secondary | ICD-10-CM | POA: Diagnosis not present

## 2022-12-12 DIAGNOSIS — H401112 Primary open-angle glaucoma, right eye, moderate stage: Secondary | ICD-10-CM | POA: Diagnosis not present

## 2022-12-21 ENCOUNTER — Encounter: Payer: Self-pay | Admitting: Podiatry

## 2022-12-21 ENCOUNTER — Ambulatory Visit (INDEPENDENT_AMBULATORY_CARE_PROVIDER_SITE_OTHER): Payer: PPO | Admitting: Podiatry

## 2022-12-21 DIAGNOSIS — M7752 Other enthesopathy of left foot: Secondary | ICD-10-CM

## 2022-12-21 DIAGNOSIS — G629 Polyneuropathy, unspecified: Secondary | ICD-10-CM

## 2022-12-21 DIAGNOSIS — M7751 Other enthesopathy of right foot: Secondary | ICD-10-CM

## 2022-12-21 MED ORDER — GABAPENTIN 100 MG PO CAPS: 100.0000 mg | ORAL_CAPSULE | Freq: Three times a day (TID) | ORAL | 3 refills | Status: AC

## 2022-12-21 NOTE — Progress Notes (Signed)
Subjective:   Patient ID: Oscar Nelson, male   DOB: 77 y.o.   MRN: [id number]   HPI Patient states I am improving but I am still getting some of the tingling and burning leg discomfort and I still get pain in my forefoot   ROS      Objective:  Physical Exam  Neurovascular status was found to be intact with inflammation around the lesser MPJs bilateral with a lot of bone exposure secondary to digital contracture with arthritis of the first MPJ improved probability that     Assessment:  He placed something into this along with exposed bone structure report progress first MPJ improved H&P reviewed     Plan:  At great length and discussed different treatment options including orthotics surgery and medication.  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  At this point I placed him on 100 mg gabapentin.  Want him to build up over a period of 1 month

## 2023-01-25 ENCOUNTER — Telehealth: Payer: Self-pay | Admitting: Podiatry

## 2023-01-25 NOTE — Telephone Encounter (Signed)
Faxed HTA auth for orthotics. 

## 2023-02-23 DIAGNOSIS — L821 Other seborrheic keratosis: Secondary | ICD-10-CM | POA: Diagnosis not present

## 2023-02-23 DIAGNOSIS — L538 Other specified erythematous conditions: Secondary | ICD-10-CM | POA: Diagnosis not present

## 2023-02-23 DIAGNOSIS — D485 Neoplasm of uncertain behavior of skin: Secondary | ICD-10-CM | POA: Diagnosis not present

## 2023-02-23 DIAGNOSIS — L570 Actinic keratosis: Secondary | ICD-10-CM | POA: Diagnosis not present

## 2023-02-23 DIAGNOSIS — L578 Other skin changes due to chronic exposure to nonionizing radiation: Secondary | ICD-10-CM | POA: Diagnosis not present

## 2023-02-23 DIAGNOSIS — L814 Other melanin hyperpigmentation: Secondary | ICD-10-CM | POA: Diagnosis not present

## 2023-02-23 DIAGNOSIS — D225 Melanocytic nevi of trunk: Secondary | ICD-10-CM | POA: Diagnosis not present

## 2023-03-20 DIAGNOSIS — H401112 Primary open-angle glaucoma, right eye, moderate stage: Secondary | ICD-10-CM | POA: Diagnosis not present

## 2023-03-20 DIAGNOSIS — H40002 Preglaucoma, unspecified, left eye: Secondary | ICD-10-CM | POA: Diagnosis not present

## 2023-03-23 ENCOUNTER — Encounter: Payer: Self-pay | Admitting: Podiatry

## 2023-03-23 ENCOUNTER — Ambulatory Visit: Payer: PPO | Admitting: Podiatry

## 2023-03-23 DIAGNOSIS — G629 Polyneuropathy, unspecified: Secondary | ICD-10-CM | POA: Diagnosis not present

## 2023-03-23 DIAGNOSIS — M7751 Other enthesopathy of right foot: Secondary | ICD-10-CM

## 2023-03-23 DIAGNOSIS — S90211A Contusion of right great toe with damage to nail, initial encounter: Secondary | ICD-10-CM

## 2023-03-24 NOTE — Progress Notes (Signed)
Subjective:   Patient ID: Oscar Nelson, male   DOB: 77 y.o.   MRN: [id number]   HPI Patient presents with several different problems with 1 being trauma to the end of the big toenail right foot and states that it has been bleeding he is concerned about infection and also discomfort in the lesser MPJ right and history of neuropathy that he is starting medication and has questions   ROS      Objective:  Physical Exam  No change neurovascular status traumatized hallux nailbed distal with bleeding that is localized no proximal edema erythema drainage noted with patient having chronic inflammation lesser MPJs and history of neuropathic change     Assessment:  Contusion right hallux along with 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  Will start 1 at night then 1 in the morning 1 midday and also I did apply Silvadene dressing to the toe instructed on soaks and it should heal uneventfully and no other treatments currently

## 2023-03-25 DIAGNOSIS — Z23 Encounter for immunization: Secondary | ICD-10-CM | POA: Diagnosis not present

## 2023-04-11 DIAGNOSIS — N400 Enlarged prostate without lower urinary tract symptoms: Secondary | ICD-10-CM | POA: Diagnosis not present

## 2023-04-19 DIAGNOSIS — N1831 Chronic kidney disease, stage 3a: Secondary | ICD-10-CM | POA: Diagnosis not present

## 2023-04-19 DIAGNOSIS — I129 Hypertensive chronic kidney disease with stage 1 through stage 4 chronic kidney disease, or unspecified chronic kidney disease: Secondary | ICD-10-CM | POA: Diagnosis not present

## 2023-04-19 DIAGNOSIS — C61 Malignant neoplasm of prostate: Secondary | ICD-10-CM | POA: Diagnosis not present

## 2023-04-19 DIAGNOSIS — N400 Enlarged prostate without lower urinary tract symptoms: Secondary | ICD-10-CM | POA: Diagnosis not present

## 2023-04-24 DIAGNOSIS — N1831 Chronic kidney disease, stage 3a: Secondary | ICD-10-CM | POA: Diagnosis not present

## 2023-06-22 ENCOUNTER — Ambulatory Visit: Payer: PPO | Admitting: Podiatry

## 2023-08-24 DIAGNOSIS — L820 Inflamed seborrheic keratosis: Secondary | ICD-10-CM | POA: Diagnosis not present

## 2023-08-24 DIAGNOSIS — L578 Other skin changes due to chronic exposure to nonionizing radiation: Secondary | ICD-10-CM | POA: Diagnosis not present

## 2023-08-24 DIAGNOSIS — L538 Other specified erythematous conditions: Secondary | ICD-10-CM | POA: Diagnosis not present

## 2023-08-24 DIAGNOSIS — L2989 Other pruritus: Secondary | ICD-10-CM | POA: Diagnosis not present

## 2023-08-24 DIAGNOSIS — L570 Actinic keratosis: Secondary | ICD-10-CM | POA: Diagnosis not present

## 2023-08-24 DIAGNOSIS — L2089 Other atopic dermatitis: Secondary | ICD-10-CM | POA: Diagnosis not present

## 2023-08-24 DIAGNOSIS — L111 Transient acantholytic dermatosis [Grover]: Secondary | ICD-10-CM | POA: Diagnosis not present

## 2023-09-06 DIAGNOSIS — H401112 Primary open-angle glaucoma, right eye, moderate stage: Secondary | ICD-10-CM | POA: Diagnosis not present

## 2023-09-06 DIAGNOSIS — H40002 Preglaucoma, unspecified, left eye: Secondary | ICD-10-CM | POA: Diagnosis not present

## 2023-11-02 DIAGNOSIS — N400 Enlarged prostate without lower urinary tract symptoms: Secondary | ICD-10-CM | POA: Diagnosis not present

## 2023-11-02 DIAGNOSIS — I10 Essential (primary) hypertension: Secondary | ICD-10-CM | POA: Diagnosis not present

## 2023-11-02 DIAGNOSIS — E559 Vitamin D deficiency, unspecified: Secondary | ICD-10-CM | POA: Diagnosis not present

## 2023-11-02 DIAGNOSIS — K9289 Other specified diseases of the digestive system: Secondary | ICD-10-CM | POA: Diagnosis not present

## 2023-11-02 DIAGNOSIS — H18519 Endothelial corneal dystrophy, unspecified eye: Secondary | ICD-10-CM | POA: Diagnosis not present

## 2023-11-02 DIAGNOSIS — Z131 Encounter for screening for diabetes mellitus: Secondary | ICD-10-CM | POA: Diagnosis not present

## 2023-11-02 DIAGNOSIS — Z0000 Encounter for general adult medical examination without abnormal findings: Secondary | ICD-10-CM | POA: Diagnosis not present

## 2023-11-02 DIAGNOSIS — Z8546 Personal history of malignant neoplasm of prostate: Secondary | ICD-10-CM | POA: Diagnosis not present

## 2023-11-02 DIAGNOSIS — N1832 Chronic kidney disease, stage 3b: Secondary | ICD-10-CM | POA: Diagnosis not present

## 2023-11-02 DIAGNOSIS — Z1331 Encounter for screening for depression: Secondary | ICD-10-CM | POA: Diagnosis not present

## 2023-11-02 DIAGNOSIS — Z136 Encounter for screening for cardiovascular disorders: Secondary | ICD-10-CM | POA: Diagnosis not present

## 2023-11-02 DIAGNOSIS — I129 Hypertensive chronic kidney disease with stage 1 through stage 4 chronic kidney disease, or unspecified chronic kidney disease: Secondary | ICD-10-CM | POA: Diagnosis not present

## 2023-11-08 DIAGNOSIS — N183 Chronic kidney disease, stage 3 unspecified: Secondary | ICD-10-CM | POA: Diagnosis not present

## 2023-11-08 DIAGNOSIS — I10 Essential (primary) hypertension: Secondary | ICD-10-CM | POA: Diagnosis not present

## 2023-11-08 DIAGNOSIS — I129 Hypertensive chronic kidney disease with stage 1 through stage 4 chronic kidney disease, or unspecified chronic kidney disease: Secondary | ICD-10-CM | POA: Diagnosis not present

## 2023-11-11 DIAGNOSIS — Z8546 Personal history of malignant neoplasm of prostate: Secondary | ICD-10-CM | POA: Diagnosis not present

## 2023-11-11 DIAGNOSIS — I10 Essential (primary) hypertension: Secondary | ICD-10-CM | POA: Diagnosis not present

## 2023-11-11 DIAGNOSIS — H409 Unspecified glaucoma: Secondary | ICD-10-CM | POA: Diagnosis not present

## 2023-11-11 DIAGNOSIS — N183 Chronic kidney disease, stage 3 unspecified: Secondary | ICD-10-CM | POA: Diagnosis not present

## 2023-11-11 DIAGNOSIS — I129 Hypertensive chronic kidney disease with stage 1 through stage 4 chronic kidney disease, or unspecified chronic kidney disease: Secondary | ICD-10-CM | POA: Diagnosis not present

## 2023-11-13 ENCOUNTER — Other Ambulatory Visit: Payer: Self-pay | Admitting: Family Medicine

## 2023-11-13 ENCOUNTER — Ambulatory Visit
Admission: RE | Admit: 2023-11-13 | Discharge: 2023-11-13 | Disposition: A | Discharge status: Home / Self Care | Payer: PRIVATE HEALTH INSURANCE | Source: Ambulatory Visit | Attending: Family Medicine | Admitting: Family Medicine

## 2023-11-13 DIAGNOSIS — M546 Pain in thoracic spine: Secondary | ICD-10-CM | POA: Diagnosis not present

## 2023-11-13 DIAGNOSIS — R0781 Pleurodynia: Secondary | ICD-10-CM

## 2023-11-13 DIAGNOSIS — M549 Dorsalgia, unspecified: Secondary | ICD-10-CM | POA: Diagnosis not present

## 2023-12-01 DIAGNOSIS — R3912 Poor urinary stream: Secondary | ICD-10-CM | POA: Diagnosis not present

## 2023-12-01 DIAGNOSIS — Z8546 Personal history of malignant neoplasm of prostate: Secondary | ICD-10-CM | POA: Diagnosis not present

## 2023-12-01 DIAGNOSIS — Q6211 Congenital occlusion of ureteropelvic junction: Secondary | ICD-10-CM | POA: Diagnosis not present

## 2023-12-01 DIAGNOSIS — N5201 Erectile dysfunction due to arterial insufficiency: Secondary | ICD-10-CM | POA: Diagnosis not present

## 2023-12-07 DIAGNOSIS — I10 Essential (primary) hypertension: Secondary | ICD-10-CM | POA: Diagnosis not present

## 2023-12-07 DIAGNOSIS — I129 Hypertensive chronic kidney disease with stage 1 through stage 4 chronic kidney disease, or unspecified chronic kidney disease: Secondary | ICD-10-CM | POA: Diagnosis not present

## 2023-12-07 DIAGNOSIS — N183 Chronic kidney disease, stage 3 unspecified: Secondary | ICD-10-CM | POA: Diagnosis not present

## 2023-12-11 DIAGNOSIS — Z8546 Personal history of malignant neoplasm of prostate: Secondary | ICD-10-CM | POA: Diagnosis not present

## 2023-12-11 DIAGNOSIS — I10 Essential (primary) hypertension: Secondary | ICD-10-CM | POA: Diagnosis not present

## 2023-12-11 DIAGNOSIS — N183 Chronic kidney disease, stage 3 unspecified: Secondary | ICD-10-CM | POA: Diagnosis not present

## 2023-12-11 DIAGNOSIS — I129 Hypertensive chronic kidney disease with stage 1 through stage 4 chronic kidney disease, or unspecified chronic kidney disease: Secondary | ICD-10-CM | POA: Diagnosis not present

## 2023-12-11 DIAGNOSIS — H409 Unspecified glaucoma: Secondary | ICD-10-CM | POA: Diagnosis not present

## 2024-01-06 DIAGNOSIS — I129 Hypertensive chronic kidney disease with stage 1 through stage 4 chronic kidney disease, or unspecified chronic kidney disease: Secondary | ICD-10-CM | POA: Diagnosis not present

## 2024-01-06 DIAGNOSIS — N183 Chronic kidney disease, stage 3 unspecified: Secondary | ICD-10-CM | POA: Diagnosis not present

## 2024-01-06 DIAGNOSIS — I10 Essential (primary) hypertension: Secondary | ICD-10-CM | POA: Diagnosis not present

## 2024-01-11 DIAGNOSIS — Z8546 Personal history of malignant neoplasm of prostate: Secondary | ICD-10-CM | POA: Diagnosis not present

## 2024-01-11 DIAGNOSIS — I129 Hypertensive chronic kidney disease with stage 1 through stage 4 chronic kidney disease, or unspecified chronic kidney disease: Secondary | ICD-10-CM | POA: Diagnosis not present

## 2024-01-11 DIAGNOSIS — N183 Chronic kidney disease, stage 3 unspecified: Secondary | ICD-10-CM | POA: Diagnosis not present

## 2024-01-11 DIAGNOSIS — I10 Essential (primary) hypertension: Secondary | ICD-10-CM | POA: Diagnosis not present

## 2024-01-11 DIAGNOSIS — H409 Unspecified glaucoma: Secondary | ICD-10-CM | POA: Diagnosis not present

## 2024-02-05 DIAGNOSIS — N183 Chronic kidney disease, stage 3 unspecified: Secondary | ICD-10-CM | POA: Diagnosis not present

## 2024-02-05 DIAGNOSIS — I129 Hypertensive chronic kidney disease with stage 1 through stage 4 chronic kidney disease, or unspecified chronic kidney disease: Secondary | ICD-10-CM | POA: Diagnosis not present

## 2024-02-05 DIAGNOSIS — I10 Essential (primary) hypertension: Secondary | ICD-10-CM | POA: Diagnosis not present

## 2024-02-07 DIAGNOSIS — H401112 Primary open-angle glaucoma, right eye, moderate stage: Secondary | ICD-10-CM | POA: Diagnosis not present

## 2024-02-07 DIAGNOSIS — H40002 Preglaucoma, unspecified, left eye: Secondary | ICD-10-CM | POA: Diagnosis not present

## 2024-02-11 DIAGNOSIS — I10 Essential (primary) hypertension: Secondary | ICD-10-CM | POA: Diagnosis not present

## 2024-02-11 DIAGNOSIS — H409 Unspecified glaucoma: Secondary | ICD-10-CM | POA: Diagnosis not present

## 2024-02-11 DIAGNOSIS — N183 Chronic kidney disease, stage 3 unspecified: Secondary | ICD-10-CM | POA: Diagnosis not present

## 2024-02-11 DIAGNOSIS — Z8546 Personal history of malignant neoplasm of prostate: Secondary | ICD-10-CM | POA: Diagnosis not present

## 2024-03-05 DIAGNOSIS — L821 Other seborrheic keratosis: Secondary | ICD-10-CM | POA: Diagnosis not present

## 2024-03-05 DIAGNOSIS — L814 Other melanin hyperpigmentation: Secondary | ICD-10-CM | POA: Diagnosis not present

## 2024-03-05 DIAGNOSIS — L578 Other skin changes due to chronic exposure to nonionizing radiation: Secondary | ICD-10-CM | POA: Diagnosis not present

## 2024-03-30 DIAGNOSIS — Z23 Encounter for immunization: Secondary | ICD-10-CM | POA: Diagnosis not present

## 2024-04-29 DIAGNOSIS — N1831 Chronic kidney disease, stage 3a: Secondary | ICD-10-CM | POA: Diagnosis not present

## 2024-05-08 DIAGNOSIS — E559 Vitamin D deficiency, unspecified: Secondary | ICD-10-CM | POA: Diagnosis not present

## 2024-05-08 DIAGNOSIS — Q631 Lobulated, fused and horseshoe kidney: Secondary | ICD-10-CM | POA: Diagnosis not present

## 2024-05-08 DIAGNOSIS — N1832 Chronic kidney disease, stage 3b: Secondary | ICD-10-CM | POA: Diagnosis not present

## 2024-05-08 DIAGNOSIS — N200 Calculus of kidney: Secondary | ICD-10-CM | POA: Diagnosis not present

## 2024-05-08 DIAGNOSIS — E785 Hyperlipidemia, unspecified: Secondary | ICD-10-CM | POA: Diagnosis not present

## 2024-05-08 DIAGNOSIS — I129 Hypertensive chronic kidney disease with stage 1 through stage 4 chronic kidney disease, or unspecified chronic kidney disease: Secondary | ICD-10-CM | POA: Diagnosis not present
